# Patient Record
Sex: Female | Born: 1990 | Race: White | Hispanic: No | Marital: Single | Smoking: Never smoker
Health system: Southern US, Community
[De-identification: ages and names within clinical notes are randomized; demographics above are authoritative.]

---

## 2010-08-02 ENCOUNTER — Other Ambulatory Visit: Payer: Self-pay | Admitting: Occupational Medicine

## 2010-08-02 ENCOUNTER — Ambulatory Visit
Admission: RE | Admit: 2010-08-02 | Discharge: 2010-08-02 | Disposition: A | Discharge status: Home / Self Care | Payer: No Typology Code available for payment source | Source: Ambulatory Visit | Attending: Occupational Medicine | Admitting: Occupational Medicine

## 2010-08-02 DIAGNOSIS — Z021 Encounter for pre-employment examination: Secondary | ICD-10-CM

## 2015-11-30 ENCOUNTER — Emergency Department (HOSPITAL_COMMUNITY)
Admission: EM | Admit: 2015-11-30 | Discharge: 2015-12-01 | Disposition: A | Discharge status: Home / Self Care | Payer: Worker's Compensation | Attending: Emergency Medicine | Admitting: Emergency Medicine

## 2015-11-30 ENCOUNTER — Encounter (HOSPITAL_COMMUNITY): Payer: Self-pay | Admitting: Emergency Medicine

## 2015-11-30 DIAGNOSIS — Z046 Encounter for general psychiatric examination, requested by authority: Secondary | ICD-10-CM | POA: Insufficient documentation

## 2015-11-30 DIAGNOSIS — Z008 Encounter for other general examination: Secondary | ICD-10-CM

## 2015-11-30 LAB — ETHANOL
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
## 2017-01-09 ENCOUNTER — Other Ambulatory Visit: Payer: Self-pay | Admitting: Nurse Practitioner

## 2017-01-09 ENCOUNTER — Ambulatory Visit
Admission: RE | Admit: 2017-01-09 | Discharge: 2017-01-09 | Disposition: A | Discharge status: Home / Self Care | Payer: 59 | Source: Ambulatory Visit | Attending: Nurse Practitioner | Admitting: Nurse Practitioner

## 2017-01-09 DIAGNOSIS — S6991XA Unspecified injury of right wrist, hand and finger(s), initial encounter: Secondary | ICD-10-CM

## 2017-08-16 ENCOUNTER — Emergency Department (HOSPITAL_COMMUNITY): Payer: No Typology Code available for payment source

## 2017-08-16 ENCOUNTER — Emergency Department (HOSPITAL_COMMUNITY)
Admission: EM | Admit: 2017-08-16 | Discharge: 2017-08-16 | Disposition: A | Discharge status: Home / Self Care | Payer: No Typology Code available for payment source | Attending: Emergency Medicine | Admitting: Emergency Medicine

## 2017-08-16 ENCOUNTER — Other Ambulatory Visit: Payer: Self-pay

## 2017-08-16 ENCOUNTER — Emergency Department (HOSPITAL_COMMUNITY)
Admission: EM | Admit: 2017-08-16 | Discharge: 2017-08-16 | Disposition: A | Discharge status: Home / Self Care | Payer: No Typology Code available for payment source | Source: Home / Self Care | Attending: Emergency Medicine | Admitting: Emergency Medicine

## 2017-08-16 ENCOUNTER — Encounter (HOSPITAL_COMMUNITY): Payer: Self-pay | Admitting: Emergency Medicine

## 2017-08-16 DIAGNOSIS — X58XXXA Exposure to other specified factors, initial encounter: Secondary | ICD-10-CM | POA: Insufficient documentation

## 2017-08-16 DIAGNOSIS — F430 Acute stress reaction: Secondary | ICD-10-CM | POA: Insufficient documentation

## 2017-08-16 DIAGNOSIS — Y929 Unspecified place or not applicable: Secondary | ICD-10-CM | POA: Insufficient documentation

## 2017-08-16 DIAGNOSIS — Y33XXXD Other specified events, undetermined intent, subsequent encounter: Secondary | ICD-10-CM | POA: Insufficient documentation

## 2017-08-16 DIAGNOSIS — Y939 Activity, unspecified: Secondary | ICD-10-CM | POA: Diagnosis not present

## 2017-08-16 DIAGNOSIS — S81802D Unspecified open wound, left lower leg, subsequent encounter: Secondary | ICD-10-CM

## 2017-08-16 DIAGNOSIS — Y999 Unspecified external cause status: Secondary | ICD-10-CM | POA: Insufficient documentation

## 2017-08-16 DIAGNOSIS — Z23 Encounter for immunization: Secondary | ICD-10-CM | POA: Insufficient documentation

## 2017-08-16 DIAGNOSIS — S8992XA Unspecified injury of left lower leg, initial encounter: Secondary | ICD-10-CM | POA: Diagnosis present

## 2017-08-16 DIAGNOSIS — S80812A Abrasion, left lower leg, initial encounter: Secondary | ICD-10-CM | POA: Diagnosis not present

## 2017-08-16 MED ORDER — TETANUS-DIPHTH-ACELL PERTUSSIS 5-2.5-18.5 LF-MCG/0.5 IM SUSP
0.5000 mL | Freq: Once | INTRAMUSCULAR | Status: AC
  Administered 2017-08-16: 0.5 mL via INTRAMUSCULAR
  Filled 2017-08-16: qty 0.5

## 2017-08-16 MED ORDER — BACITRACIN ZINC 500 UNIT/GM EX OINT
TOPICAL_OINTMENT | Freq: Two times a day (BID) | CUTANEOUS | Status: DC
  Administered 2017-08-16: 1 via TOPICAL
  Filled 2017-08-16: qty 0.9

## 2017-08-16 NOTE — Discharge Instructions (Addendum)
Keep abrasion covered. No restrictions

## 2017-08-16 NOTE — ED Notes (Signed)
Bed: WTR9 Expected date:  Expected time:  Means of arrival:  Comments: 
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
## 2017-08-16 NOTE — Discharge Instructions (Addendum)
Use neosporin ointment to the area and keep it covered while you are working. Return for any problems.
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
